# Patient Record
Sex: Male | Born: 1955 | Race: Black or African American | Hispanic: No | Marital: Married | Smoking: Never smoker
Health system: Southern US, Community
[De-identification: ages and names within clinical notes are randomized; demographics above are authoritative.]

## PROBLEM LIST (undated history)

## (undated) DIAGNOSIS — I10 Essential (primary) hypertension: Secondary | ICD-10-CM

## (undated) DIAGNOSIS — T7840XA Allergy, unspecified, initial encounter: Secondary | ICD-10-CM

## (undated) HISTORY — DX: Essential (primary) hypertension: I10

## (undated) HISTORY — DX: Allergy, unspecified, initial encounter: T78.40XA

---

## 2005-08-14 ENCOUNTER — Emergency Department: Payer: Self-pay | Admitting: Emergency Medicine

## 2007-05-24 ENCOUNTER — Inpatient Hospital Stay: Payer: Self-pay | Admitting: Internal Medicine

## 2007-05-24 ENCOUNTER — Other Ambulatory Visit: Payer: Self-pay

## 2009-02-18 ENCOUNTER — Ambulatory Visit: Payer: Self-pay | Admitting: Internal Medicine

## 2009-12-29 ENCOUNTER — Inpatient Hospital Stay: Payer: Self-pay | Admitting: Internal Medicine

## 2010-07-21 ENCOUNTER — Ambulatory Visit: Payer: Self-pay | Admitting: Nephrology

## 2010-10-24 ENCOUNTER — Emergency Department: Payer: Self-pay | Admitting: Emergency Medicine

## 2011-02-02 ENCOUNTER — Ambulatory Visit: Payer: Self-pay | Admitting: Surgery

## 2011-02-06 LAB — PATHOLOGY REPORT

## 2011-04-21 IMAGING — NM NM LUNG SCAN
2 series · 16 of 16 positions shown · non-contrast
Comparison: none

REASON FOR EXAM: sob cp hx of dvt
COMMENTS:

PROCEDURE:     NM  - NM VQ LUNG SCAN  - [DATE]  [DATE] [DATE]  [DATE]
RESULT:     History: Shortness of breath and chest pain.
Comparison Study: Prior V/Q scan of 05/26/2007.

[Series 1000: lung ventilation · 3.90mm/px · 4 acquisitions, 8 frames shown]
[im 1/4]
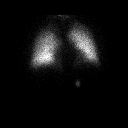
[im 1/4]
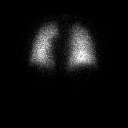
[im 2/4]
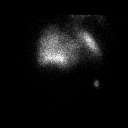
[im 2/4]
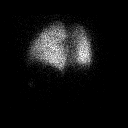
[im 3/4]
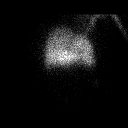
[im 3/4]
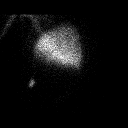
[im 4/4]
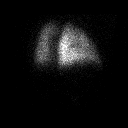
[im 4/4]
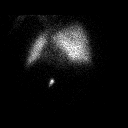

[Series 1000: lung perfusion · 1.95mm/px · 4 acquisitions, 8 frames shown]
[im 1/4]
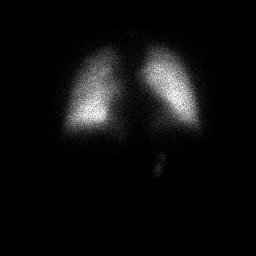
[im 1/4]
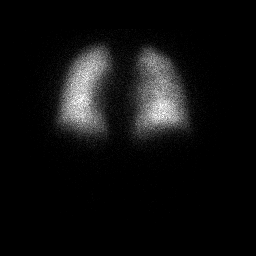
[im 2/4]
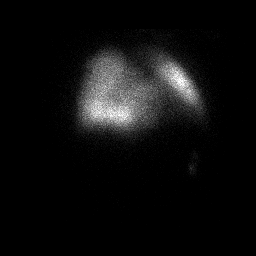
[im 2/4]
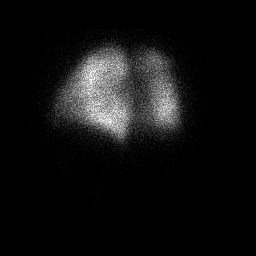
[im 3/4]
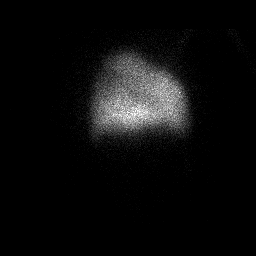
[im 3/4]
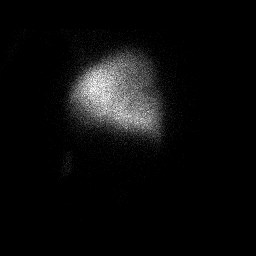
[im 4/4]
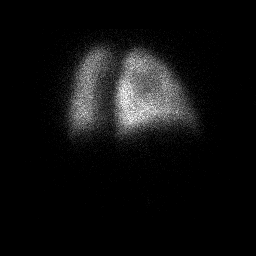
[im 4/4]
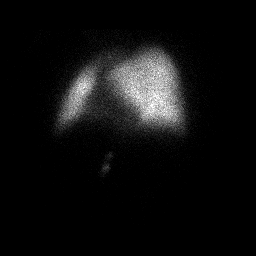

[16 of 16 positions shown; findings below may reference images not displayed]

FINDINGS: Standard ventilation perfusion scan performed with 37.4 mCi of
technetium 99 M DTPA and 4.3 mCi of technetium 99m MAA. Ventilation
perfusion scan is unchanged  from prior study of 1338. No prominent
ventilation perfusion mismatches are noted.
IMPRESSION: Low probability pulmonary embolus.

## 2014-03-03 ENCOUNTER — Ambulatory Visit: Payer: Self-pay | Admitting: Surgery

## 2014-03-04 LAB — PATHOLOGY REPORT

## 2014-12-11 NOTE — Op Note (Signed)
PATIENT NAME:  Reginald Stevens, Reginald Stevens MR#:  [id number] DATE OF BIRTH:  [birth year]  DATE OF PROCEDURE:  03/03/2014   His colonoscopy was done and an immediate computer generated report was made; however, it is now not found in the system, therefore this is being dictated.   PREOPERATIVE DIAGNOSIS: History of colonic polyps.  POSTOPERATIVE DIAGNOSIS:  Colonic polyps.   PROCEDURE: Colonoscopy with hot biopsy and fulguration of polyps.   SURGEON: Renda RollsWilton Smith, M.D.   ANESTHESIA: Monitored anesthesia care with intravenous sedation.   PROCEDURE IN DETAIL:  With the patient on the stretcher in the left lateral decubitus position and monitored with pulse oximetry and intermittent blood pressure recordings, was given oxygen via nasal cannula at a rate of 2 L/min.   Digital anorectal exam demonstrated no palpable mass.   The Olympus video colonoscope was inserted and was manipulated around to the cecum. A picture was taken of the appendiceal lumen and of the ileocecal valve. The scope was gradually pulled back examining the colonic mucosa. Colon preparation was good. A minimal amount of bilious feculent material was aspirated. The mucosa was typical in appearance. The scope was gradually pulled back to a point some 25 cm from the anal os finding a small benign-appearing polyp which just appeared to be about 4 or 5 mm in dimension and was removed with the hot biopsy forceps and cauterized the base, completely obliterating the polyp and was submitted in formalin for routine pathology. There was no significant bleeding at the site. The scope was gradually further pulled back and identified another small polyp in the rectum. The scope was retroflexed to view the distal rectum seeing some minimal hemorrhoidal veins. The scope was again straightened and further demonstrated distal rectal polyp which also appeared benign, was biopsied with the hot biopsy forceps and obliterated completely and submitted in a separate  container of formalin. A small amount of bleeding occurred and was cauterized and bleeding stopped. The scope was subsequently removed. Th patient appeared to be in entirely satisfactory condition and was prepared for transfer to the recovery room.     ____________________________ Shela CommonsJ. Renda RollsWilton Smith, MD jws:ds D: 03/09/2014 20:02:26 ET T: 03/09/2014 20:54:11 ET JOB#: [id number]  cc: Adella HareJ. Wilton Smith, MD, <Dictator> Adella HareWILTON J SMITH MD ELECTRONICALLY SIGNED 03/10/2014 9:07

## 2016-10-15 ENCOUNTER — Other Ambulatory Visit: Payer: Self-pay | Admitting: Internal Medicine

## 2016-10-15 ENCOUNTER — Ambulatory Visit
Admission: RE | Admit: 2016-10-15 | Discharge: 2016-10-15 | Disposition: A | Discharge status: Home / Self Care | Payer: Managed Care, Other (non HMO) | Source: Ambulatory Visit | Attending: Internal Medicine | Admitting: Internal Medicine

## 2016-10-15 DIAGNOSIS — R600 Localized edema: Secondary | ICD-10-CM

## 2017-05-08 IMAGING — US US EXTREM LOW VENOUS*R*
1 series · 13 of 24 positions shown · non-contrast
Comparison: Ultrasound 10/24/2010.

CLINICAL DATA: Right lower extremity edema.



[Series 1: us extrem low venous*right* · 0.10mm/px · 13 of 31 slices shown]
[im 1/31]
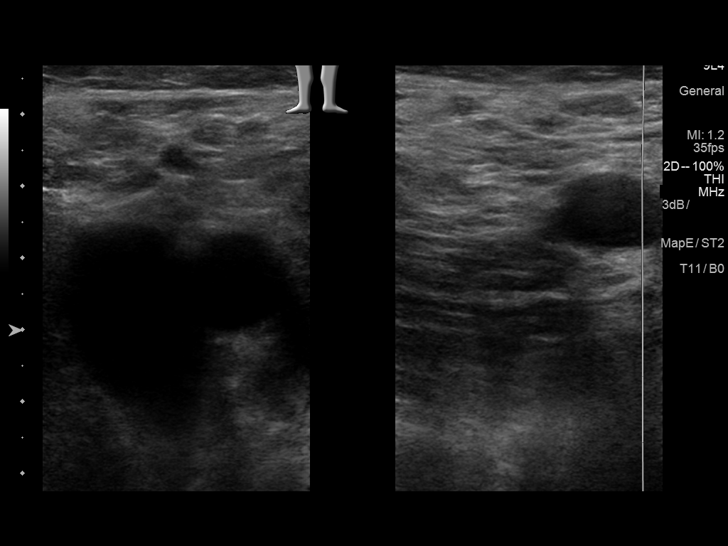
[im 3/31]
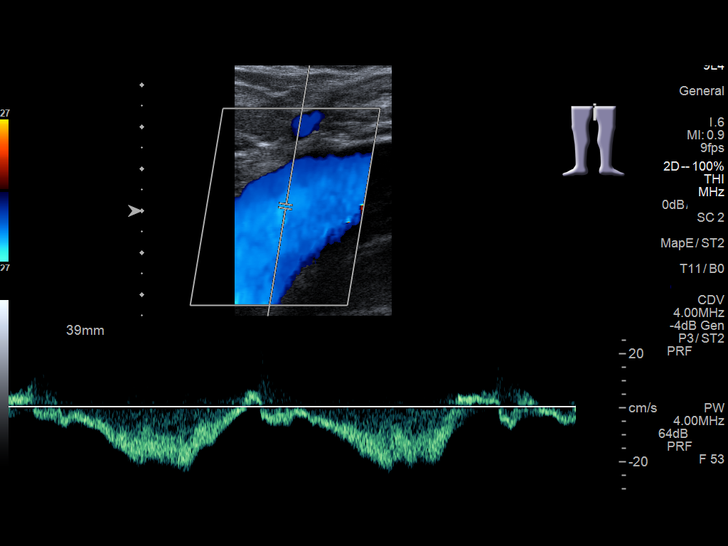
[im 6/31]
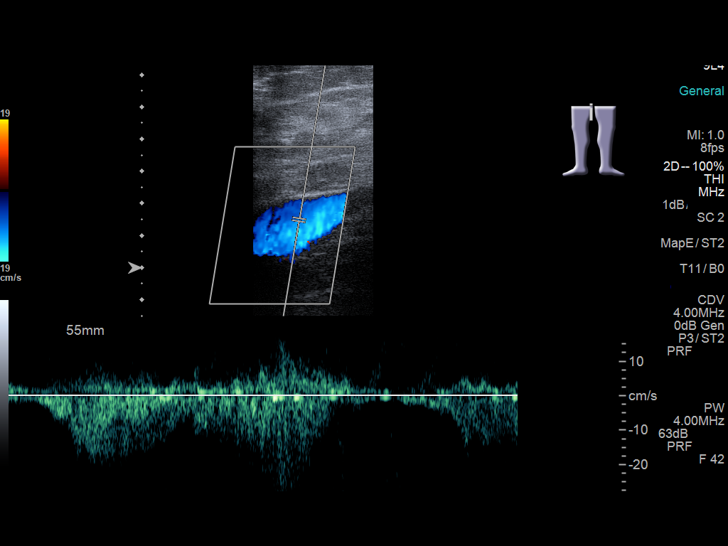
[im 8/31]
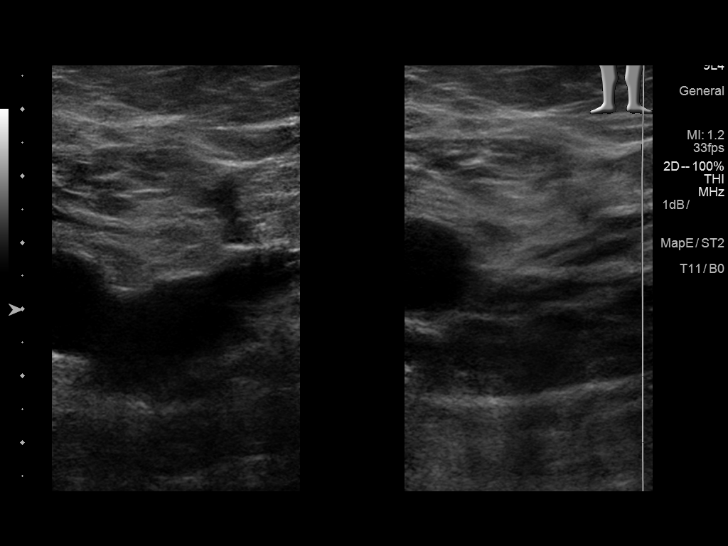
[im 11/31]
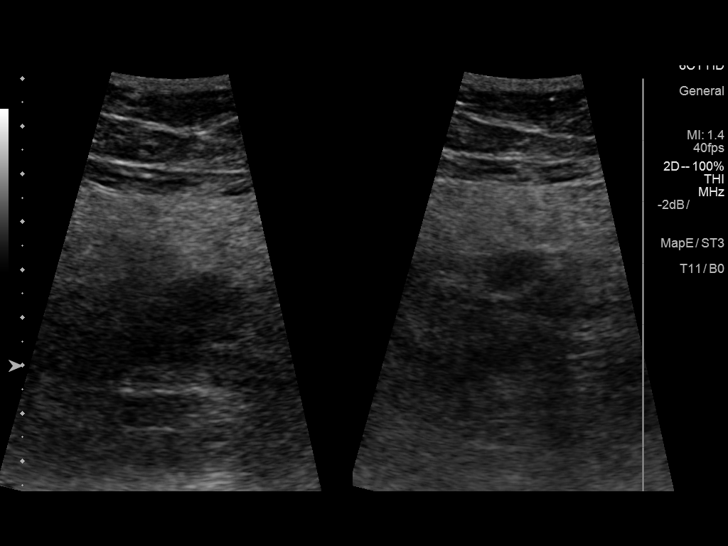
[im 14/31]
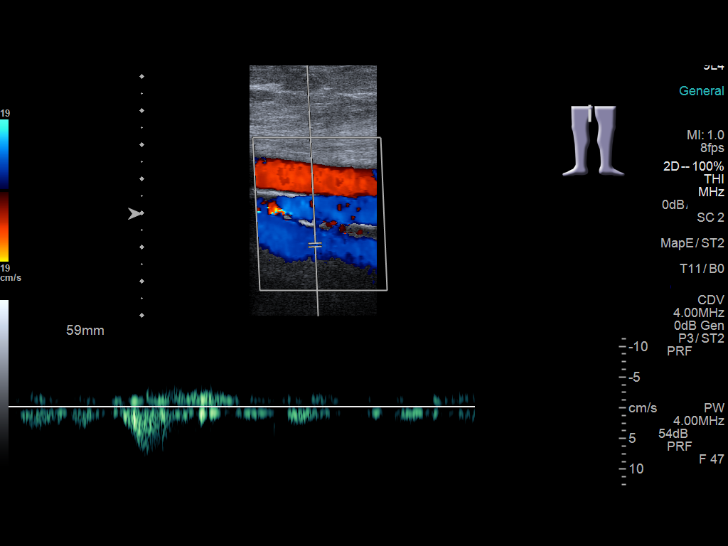
[im 16/31]
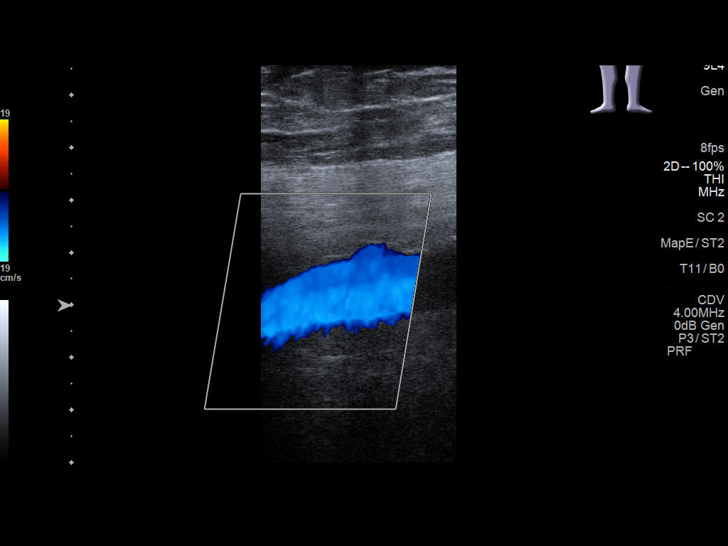
[im 17/31]
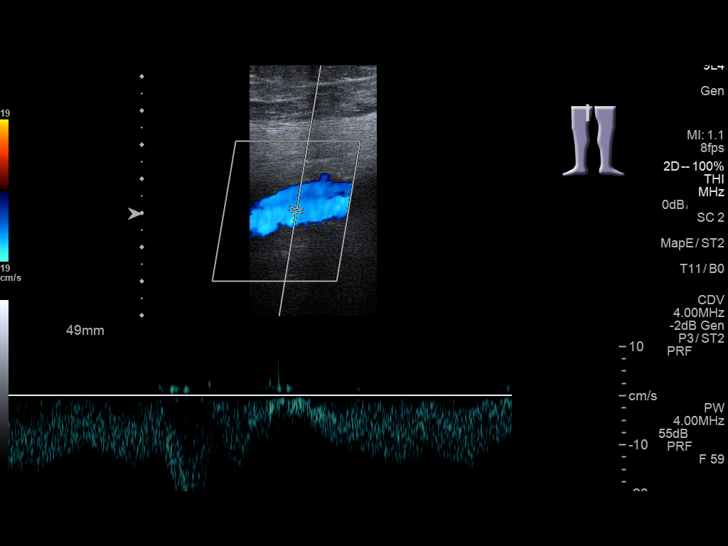
[im 20/31]
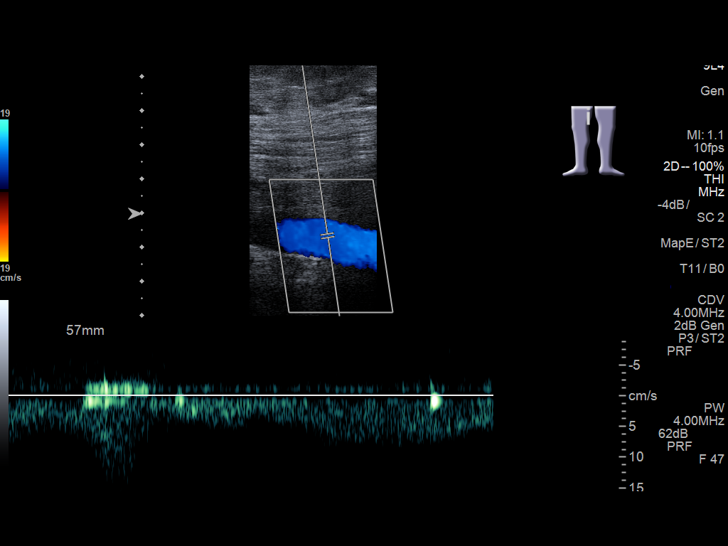
[im 23/31]
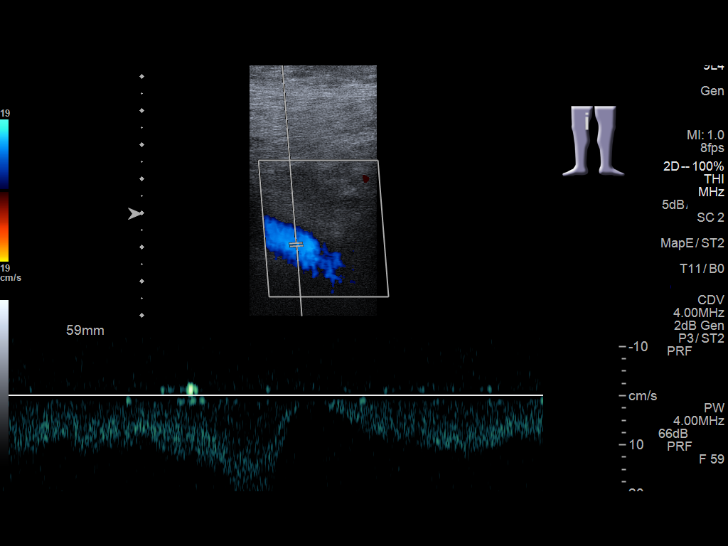
[im 25/31]
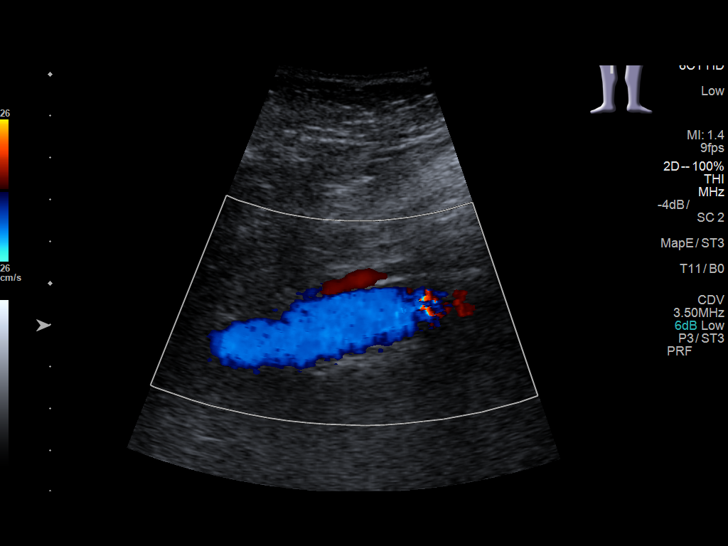
[im 28/31]
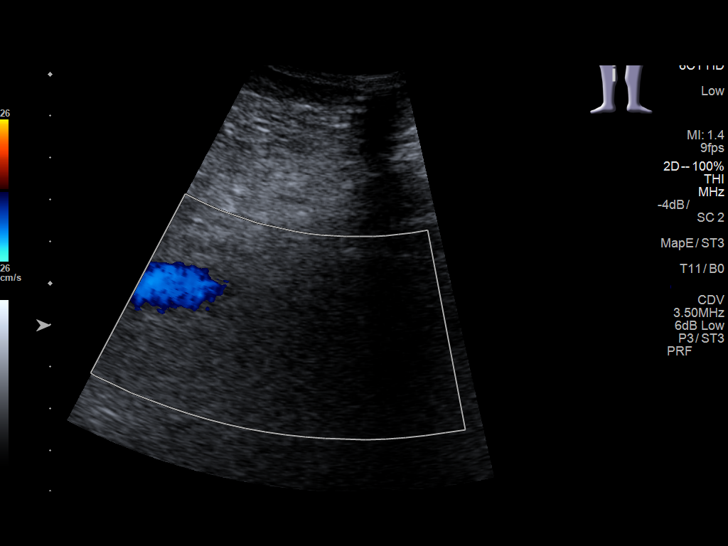
[im 31/31]
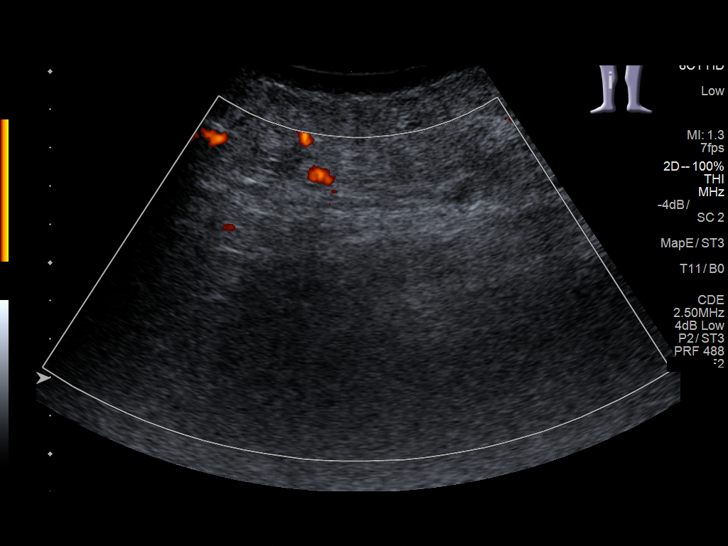

[13 of 24 positions shown; findings below may reference images not displayed]

FINDINGS: Contralateral Common Femoral Vein: Respiratory phasicity is normal
and symmetric with the symptomatic side. No evidence of thrombus.
Normal compressibility.

Common Femoral Vein: No evidence of thrombus. Normal
compressibility, respiratory phasicity and response to augmentation.

Saphenofemoral Junction: No evidence of thrombus. Normal
compressibility and flow on color Doppler imaging.

Profunda Femoral Vein: No evidence of thrombus. Normal
compressibility and flow on color Doppler imaging.

Femoral Vein: No evidence of thrombus. Normal compressibility,
respiratory phasicity and response to augmentation.

Popliteal Vein: No evidence of thrombus. Normal compressibility,
respiratory phasicity and response to augmentation.

Calf Veins: Poor visualization of calf veins due to overlying edema.

Superficial Great Saphenous Vein: No evidence of thrombus. Normal
compressibility and flow on color Doppler imaging.

Other Findings:  Soft tissue edema.
IMPRESSION: No evidence of deep venous thrombosis. Poor visualization of calf
veins due to overlying soft tissue edema .

## 2018-10-01 ENCOUNTER — Encounter: Payer: Self-pay | Admitting: Urology

## 2018-10-01 ENCOUNTER — Ambulatory Visit (INDEPENDENT_AMBULATORY_CARE_PROVIDER_SITE_OTHER): Payer: BLUE CROSS/BLUE SHIELD | Admitting: Urology

## 2018-10-01 DIAGNOSIS — R972 Elevated prostate specific antigen [PSA]: Secondary | ICD-10-CM

## 2018-10-01 LAB — BLADDER SCAN AMB NON-IMAGING: SCAN RESULT: 15

## 2018-10-01 NOTE — Patient Instructions (Addendum)

## 2018-10-01 NOTE — Progress Notes (Signed)
10/01/2018 11:02 AM   Reginald Stevens 06-03-1956 [id number]  Referring provider: Lynnea FerrierKlein, Bert J III, MD [address] [facility] [place], [place] [id number]  Chief Complaint  Patient presents with  . Elevated PSA    HPI:  Mr. Reginald Stevens is a 63 year old male with a rising PSA.  His PSA was the following: September 2016 PSA 2.61 November 2018 PSA 4.32 January 2020 PSA 6.66  No FH of PCa. His brother had liver cancer and dad esophageal.   The patient has an AUASS = 12, noc x 1, moxed QOL. He can get an erection but has trouble maintaining. He drives a fork lift.   Modifying factors: There are no other modifying factors  Associated signs and symptoms: There are no other associated signs and symptoms Aggravating and relieving factors: There are no other aggravating or relieving factors Severity: Moderate Duration: Persistent    PMH: Past Medical History:  Diagnosis Date  . Allergy   . Hypertension     Surgical History:   Home Medications:  Allergies as of 10/01/2018   Not on File     Medication List       Accurate as of October 01, 2018 11:02 AM. Always use your most recent med list.        fluticasone 50 MCG/ACT nasal spray Commonly known as:  FLONASE Place into the nose.   ibuprofen 200 MG tablet Commonly known as:  ADVIL,MOTRIN Take by mouth.   loratadine 10 MG tablet Commonly known as:  CLARITIN Take by mouth.   losartan 25 MG tablet Commonly known as:  COZAAR Take by mouth.   PROAIR HFA 108 (90 Base) MCG/ACT inhaler Generic drug:  albuterol INHALE 2 PUFFS EVERY 4 HOURS IF NEEDED FOR WHEEZING   vitamin B-12 1000 MCG tablet Commonly known as:  CYANOCOBALAMIN Take by mouth.       Allergies: Allergies not on file  Family History: Family History  Problem Relation Age of Onset  . Prostate cancer Neg Hx   . Bladder Cancer Neg Hx   . Kidney cancer Neg Hx     Social History:  reports that he has never smoked. He has never  used smokeless tobacco. He reports current alcohol use. He reports that he does not use drugs.  ROS: UROLOGY Frequent Urination?: Yes Hard to postpone urination?: Yes Burning/pain with urination?: No Get up at night to urinate?: Yes Leakage of urine?: No Urine stream starts and stops?: No Trouble starting stream?: No Do you have to strain to urinate?: No Blood in urine?: No Urinary tract infection?: No Sexually transmitted disease?: No Injury to kidneys or bladder?: No Painful intercourse?: No Weak stream?: No Erection problems?: Yes Penile pain?: No  Gastrointestinal Nausea?: No Vomiting?: No Indigestion/heartburn?: No Diarrhea?: No Constipation?: No  Constitutional Fever: No Night sweats?: No Weight loss?: No Fatigue?: No  Skin Skin rash/lesions?: No Itching?: No  Eyes Blurred vision?: No Double vision?: No  Ears/Nose/Throat Sore throat?: No Sinus problems?: No  Hematologic/Lymphatic Swollen glands?: No Easy bruising?: No  Cardiovascular Leg swelling?: Yes Chest pain?: No  Respiratory Cough?: Yes Shortness of breath?: Yes  Endocrine Excessive thirst?: No  Musculoskeletal Back pain?: Yes Joint pain?: Yes  Neurological Headaches?: No Dizziness?: No  Psychologic Depression?: No Anxiety?: No  Physical Exam: There were no vitals taken for this visit.  Constitutional:  Alert and oriented, No acute distress. HEENT: [place] AT, moist mucus membranes.  Trachea midline, no masses. Cardiovascular: No clubbing, cyanosis, or edema. Respiratory:  Normal respiratory effort, no increased work of breathing. GI: Abdomen is soft, nontender, nondistended, no abdominal masses GU: No CVA tenderness Lymph: No cervical or inguinal lymphadenopathy. Skin: No rashes, bruises or suspicious lesions. Neurologic: Grossly intact, no focal deficits, moving all 4 extremities. Psychiatric: Normal mood and affect. GU: difficult exam, apex normal.   Laboratory Data: No  results found for: WBC, HGB, HCT, MCV, PLT  No results found for: CREATININE  No results found for: PSA  No results found for: TESTOSTERONE  No results found for: HGBA1C  Urinalysis No results found for: COLORURINE, APPEARANCEUR, LABSPEC, PHURINE, GLUCOSEU, HGBUR, BILIRUBINUR, KETONESUR, PROTEINUR, UROBILINOGEN, NITRITE, LEUKOCYTESUR  No results found for: LABMICR, WBCUA, RBCUA, LABEPIT, MUCUS, BACTERIA  No results found for this or any previous visit. No results found for this or any previous visit. No results found for this or any previous visit. No results found for this or any previous visit. No results found for this or any previous visit. No results found for this or any previous visit. No results found for this or any previous visit. No results found for this or any previous visit.  Assessment & Plan:    1. Elevated PSA I had a long discussion with the patient on the nature of elevated PSA - benign vs malignant causes. We discussed age specific levels and that PCa can be seen on a biopsy with very low PSA levels (<=2.5). We discussed the nature risks and benefits of continued surveillance, other lab tests, imaging as well as prostate biopsy. We discussed the management of prostate cancer might include active surveillance or treatment depending on biopsy findings. All questions answered. He elects to proceed with biopsy.   - Bladder Scan (Post Void Residual) in office   No follow-ups on file.  Jerilee Field, MD  [facility] [address], [address] [place], [place] [address] [phone]

## 2018-10-15 ENCOUNTER — Ambulatory Visit (INDEPENDENT_AMBULATORY_CARE_PROVIDER_SITE_OTHER): Payer: BLUE CROSS/BLUE SHIELD | Admitting: Urology

## 2018-10-15 ENCOUNTER — Other Ambulatory Visit: Payer: Self-pay | Admitting: Urology

## 2018-10-15 ENCOUNTER — Encounter: Payer: Self-pay | Admitting: Urology

## 2018-10-15 VITALS — BP 134/82 | HR 92 | Wt 337.0 lb

## 2018-10-15 DIAGNOSIS — R972 Elevated prostate specific antigen [PSA]: Secondary | ICD-10-CM

## 2018-10-15 MED ORDER — GENTAMICIN SULFATE 40 MG/ML IJ SOLN
80.0000 mg | Freq: Once | INTRAMUSCULAR | Status: AC
  Administered 2018-10-15: 80 mg via INTRAMUSCULAR

## 2018-10-15 MED ORDER — LEVOFLOXACIN 500 MG PO TABS
500.0000 mg | ORAL_TABLET | Freq: Once | ORAL | Status: AC
  Administered 2018-10-15: 500 mg via ORAL

## 2018-10-15 NOTE — Patient Instructions (Signed)

## 2018-10-15 NOTE — Progress Notes (Signed)
Reginald Stevens is a 63 year old male with a rising PSA.  His PSA was the following: September 2016 PSA 2.61 November 2018 PSA 4.32 January 2020 PSA 6.66  No FH of PCa. His brother had liver cancer and dad esophageal.   The patient has an AUASS = 12, noc x 1, moxed QOL. He can get an erection but has trouble maintaining. 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   Prostate Biopsy Procedure   Informed consent was obtained after discussing risks/benefits of the procedure.  A time out was performed to ensure correct patient identity.  Pre-Procedure: - Last PSA Level: 6.66 - We drew another PSA prior to the biopsy for record.  - Gentamicin given prophylactically - Levaquin 500 mg administered PO -DRE: Even in lateral decubitus it is a difficult exam, only the apex palpable -Transrectal Ultrasound performed revealing a 87.71 gm prostate (6.08 x 4.83 x 5.70) -No significant hypoechoic or median lobe noted  Procedure: - Prostate block performed using 10 cc 1% lidocaine and biopsies taken from sextant areas, a total of 12 under ultrasound guidance.  Post-Procedure: - Patient tolerated the procedure well - He was counseled to seek immediate medical attention if experiences any severe pain, significant bleeding, or fevers - Return in one week to discuss biopsy results

## 2018-10-16 LAB — PSA: Prostate Specific Ag, Serum: 5.2 ng/mL — ABNORMAL HIGH (ref 0.0–4.0)

## 2018-10-20 ENCOUNTER — Other Ambulatory Visit: Payer: Self-pay | Admitting: Urology

## 2018-10-21 LAB — PATHOLOGY REPORT

## 2018-10-27 ENCOUNTER — Telehealth: Payer: Self-pay

## 2018-10-27 NOTE — Telephone Encounter (Signed)
Called and left message on machine for patient to return call. Per Dr Mena Goes, no cancer seen in biospy. He will go over everything and make a plan with patient at  10/29/18 visit.

## 2018-10-29 ENCOUNTER — Ambulatory Visit (INDEPENDENT_AMBULATORY_CARE_PROVIDER_SITE_OTHER): Payer: BLUE CROSS/BLUE SHIELD | Admitting: Urology

## 2018-10-29 ENCOUNTER — Other Ambulatory Visit: Payer: Self-pay

## 2018-10-29 ENCOUNTER — Encounter: Payer: Self-pay | Admitting: Urology

## 2018-10-29 VITALS — BP 128/81 | HR 91 | Wt 340.6 lb

## 2018-10-29 DIAGNOSIS — N138 Other obstructive and reflux uropathy: Secondary | ICD-10-CM

## 2018-10-29 DIAGNOSIS — N5201 Erectile dysfunction due to arterial insufficiency: Secondary | ICD-10-CM

## 2018-10-29 DIAGNOSIS — N401 Enlarged prostate with lower urinary tract symptoms: Secondary | ICD-10-CM

## 2018-10-29 DIAGNOSIS — R972 Elevated prostate specific antigen [PSA]: Secondary | ICD-10-CM

## 2018-10-29 DIAGNOSIS — R3912 Poor urinary stream: Secondary | ICD-10-CM

## 2018-10-29 MED ORDER — TADALAFIL 20 MG PO TABS: 20.0000 mg | ORAL_TABLET | Freq: Every day | ORAL | 6 refills | Status: DC | PRN

## 2018-10-29 NOTE — Progress Notes (Signed)
10/29/2018 11:34 AM   Josie Dixon 08-16-1956 [id number]  Referring provider: Lynnea Ferrier, MD [address] [facility] [place], [place] [address]  No chief complaint on file.   HPI:  Mr. Deboise is a 63 year old male with a rising PSA. His PSA was the following: September 2016 PSA 2.61 November 2018 PSA 4.32 January 2020 PSA 6.66 Feb 2020 PSA 5.2, Biopsy - benign and inflammation, prostate 88 g   No FH of PCa. His brother had liver cancer and dad esophageal.   The patient has an AUASS = 12, noc x 1, moxed QOL. He can get an erection but has trouble maintaining. He tried Cialis in the past. He drives a fork lift.  He returns to review bx results and management of BPH. He has no bothersome LUTS. He had no problems after the biopsy.    PMH: Past Medical History:  Diagnosis Date  . Allergy   . Hypertension     Surgical History: No past surgical history on file.  Home Medications:  Allergies as of 10/29/2018   No Known Allergies     Medication List       Accurate as of October 29, 2018 11:34 AM. Always use your most recent med list.        fluticasone 50 MCG/ACT nasal spray Commonly known as:  FLONASE Place into the nose.   ibuprofen 200 MG tablet Commonly known as:  ADVIL,MOTRIN Take by mouth.   loratadine 10 MG tablet Commonly known as:  CLARITIN Take by mouth.   losartan 25 MG tablet Commonly known as:  COZAAR Take by mouth.   ProAir HFA 108 (90 Base) MCG/ACT inhaler Generic drug:  albuterol INHALE 2 PUFFS EVERY 4 HOURS IF NEEDED FOR WHEEZING   vitamin B-12 1000 MCG tablet Commonly known as:  CYANOCOBALAMIN Take by mouth.       Allergies: No Known Allergies  Family History: Family History  Problem Relation Age of Onset  . Prostate cancer Neg Hx   . Bladder Cancer Neg Hx   . Kidney cancer Neg Hx     Social History:  reports that he has never smoked. He has never used smokeless tobacco. He reports current  alcohol use. He reports that he does not use drugs.  ROS:                                        Physical Exam: There were no vitals taken for this visit.  Constitutional:  Alert and oriented, No acute distress. HEENT: [place] AT, moist mucus membranes.  Trachea midline, no masses. Cardiovascular: No clubbing, cyanosis, or edema. Respiratory: Normal respiratory effort, no increased work of breathing. GI: Abdomen is soft, nontender, nondistended, no abdominal masses Skin: No rashes, bruises or suspicious lesions. Neurologic: Grossly intact, no focal deficits, moving all 4 extremities. Psychiatric: Normal mood and affect.  Laboratory Data: No results found for: WBC, HGB, HCT, MCV, PLT  No results found for: CREATININE  No results found for: PSA  No results found for: TESTOSTERONE  No results found for: HGBA1C  Urinalysis No results found for: COLORURINE, APPEARANCEUR, LABSPEC, PHURINE, GLUCOSEU, HGBUR, BILIRUBINUR, KETONESUR, PROTEINUR, UROBILINOGEN, NITRITE, LEUKOCYTESUR  No results found for: LABMICR, WBCUA, RBCUA, LABEPIT, MUCUS, BACTERIA   No results found for this or any previous visit. No results found for this or any previous visit. No results found for this or  any previous visit. No results found for this or any previous visit. No results found for this or any previous visit. No results found for this or any previous visit. No results found for this or any previous visit. No results found for this or any previous visit.  Assessment & Plan:    PSA - biopsy benign and PSA density low. Follow.   BPH, luts - discussed surveillance, alpha blockers, daily PDE 5 inhibitor, 5ari and procedures.  He is not particularly bothered by any symptoms and will continue surveillance.  ED - discussed nature r/b of pde5i and Cialis sent.   No follow-ups on file.  Jerilee Field, MD  [facility] [address]  [place], [place] [phone]

## 2018-10-29 NOTE — Patient Instructions (Signed)

## 2019-04-29 ENCOUNTER — Encounter: Payer: Self-pay | Admitting: Urology

## 2019-04-29 ENCOUNTER — Ambulatory Visit: Payer: BC Managed Care – PPO | Admitting: Urology

## 2019-04-29 ENCOUNTER — Other Ambulatory Visit: Payer: Self-pay

## 2019-04-29 VITALS — BP 150/83 | HR 85 | Ht 74.0 in | Wt 340.0 lb

## 2019-04-29 DIAGNOSIS — N401 Enlarged prostate with lower urinary tract symptoms: Secondary | ICD-10-CM | POA: Diagnosis not present

## 2019-04-29 DIAGNOSIS — N5201 Erectile dysfunction due to arterial insufficiency: Secondary | ICD-10-CM | POA: Diagnosis not present

## 2019-04-29 DIAGNOSIS — R972 Elevated prostate specific antigen [PSA]: Secondary | ICD-10-CM

## 2019-04-29 DIAGNOSIS — N138 Other obstructive and reflux uropathy: Secondary | ICD-10-CM | POA: Diagnosis not present

## 2019-04-29 MED ORDER — SILDENAFIL CITRATE 20 MG PO TABS: 20.0000 mg | ORAL_TABLET | Freq: Every day | ORAL | 11 refills | Status: AC

## 2019-04-29 NOTE — Progress Notes (Signed)
04/29/2019 11:05 AM   Reginald Stevens November 22, 1955 [id number]  Referring provider: Adin Hector, MD [place] [facility] [place],  [place] [address]  Chief Complaint  Patient presents with  . Follow-up    HPI:  F/u -   1) Elevated PSA - His PSA was the following: September 2016 PSA 2.61 November 2018 PSA 4.32 January 2020 PSA 6.66 Feb 2020 PSA 5.2, Biopsy - benign and inflammation, prostate 88 g   No FH of PCa. His brother had liver cancer and dad esophageal.  The patient has an AUASS = 12, noc x 1, moxed QOL.   2) ED - He can get an erection but has trouble maintaining. He tried Cialis in the past. He drives a fork lift.Cialis rx'd Mar 2020.   I ordered a PSA but don't see that it was done. He was not able to pick up Cialis as it was expensive. No voiding complaints.  PMH: Past Medical History:  Diagnosis Date  . Allergy   . Hypertension     Surgical History: No past surgical history on file.  Home Medications:  Allergies as of 04/29/2019   No Known Allergies     Medication List       Accurate as of April 29, 2019 11:05 AM. If you have any questions, ask your nurse or doctor.        STOP taking these medications   losartan 25 MG tablet Commonly known as: COZAAR Stopped by: Festus Aloe, MD     TAKE these medications   fluticasone 50 MCG/ACT nasal spray Commonly known as: FLONASE Place into the nose.   ibuprofen 200 MG tablet Commonly known as: ADVIL Take by mouth.   loratadine 10 MG tablet Commonly known as: CLARITIN Take by mouth.   ProAir HFA 108 (90 Base) MCG/ACT inhaler Generic drug: albuterol INHALE 2 PUFFS EVERY 4 HOURS IF NEEDED FOR WHEEZING   Symbicort 160-4.5 MCG/ACT inhaler Generic drug: budesonide-formoterol Inhale into the lungs.   tadalafil 20 MG tablet Commonly known as: CIALIS Take 1 tablet (20 mg total) by mouth daily as needed for erectile dysfunction.   vitamin B-12 1000 MCG tablet  Commonly known as: CYANOCOBALAMIN Take by mouth.       Allergies: No Known Allergies  Family History: Family History  Problem Relation Age of Onset  . Prostate cancer Neg Hx   . Bladder Cancer Neg Hx   . Kidney cancer Neg Hx     Social History:  reports that he has never smoked. He has never used smokeless tobacco. He reports current alcohol use. He reports that he does not use drugs.  ROS: UROLOGY Frequent Urination?: Yes Hard to postpone urination?: No Burning/pain with urination?: No Get up at night to urinate?: Yes Leakage of urine?: No Urine stream starts and stops?: No Trouble starting stream?: No Do you have to strain to urinate?: No Blood in urine?: No Urinary tract infection?: No Sexually transmitted disease?: No Injury to kidneys or bladder?: No Painful intercourse?: No Weak stream?: No Erection problems?: No Penile pain?: No  Gastrointestinal Nausea?: No Vomiting?: No Indigestion/heartburn?: No Diarrhea?: No Constipation?: No  Constitutional Fever: No Night sweats?: No Weight loss?: No Fatigue?: No  Skin Skin rash/lesions?: No Itching?: No  Eyes Blurred vision?: No Double vision?: No  Ears/Nose/Throat Sore throat?: No Sinus problems?: No  Hematologic/Lymphatic Swollen glands?: No Easy bruising?: No  Cardiovascular Leg swelling?: No Chest pain?: No  Respiratory Cough?: No Shortness of breath?: No  Endocrine Excessive thirst?: No  Musculoskeletal Back pain?: No Joint pain?: No  Neurological Headaches?: No Dizziness?: No  Psychologic Depression?: No Anxiety?: No  Physical Exam: BP (!) 150/83   Pulse 85   Ht 6\' 2"  (1.88 m)   Wt (!) 154.2 kg   BMI 43.65 kg/m   Constitutional:  Alert and oriented, No acute distress. HEENT: [place] AT, moist mucus membranes.  Trachea midline, no masses. Cardiovascular: No clubbing, cyanosis, or edema. Respiratory: Normal respiratory effort, no increased work of breathing. GI: Abdomen  is soft, nontender, nondistended, no abdominal masses Lymph: No cervical or inguinal lymphadenopathy. Skin: No rashes, bruises or suspicious lesions. Neurologic: Grossly intact, no focal deficits, moving all 4 extremities. Psychiatric: Normal mood and affect.  Laboratory Data: No results found for: WBC, HGB, HCT, MCV, PLT  No results found for: CREATININE  No results found for: PSA  No results found for: TESTOSTERONE  No results found for: HGBA1C  Urinalysis No results found for: COLORURINE, APPEARANCEUR, LABSPEC, PHURINE, GLUCOSEU, HGBUR, BILIRUBINUR, KETONESUR, PROTEINUR, UROBILINOGEN, NITRITE, LEUKOCYTESUR  No results found for: LABMICR, WBCUA, RBCUA, LABEPIT, MUCUS, BACTERIA  Pertinent Imaging: n/a No results found for this or any previous visit. No results found for this or any previous visit. No results found for this or any previous visit. No results found for this or any previous visit. No results found for this or any previous visit. No results found for this or any previous visit. No results found for this or any previous visit. No results found for this or any previous visit.  Assessment & Plan:    BPH - stable   ED - rx for sildenafil sent - discussed again nature r/b/a to pde5i and off label use of 20 mg tabs.   PSA - PSA was sent. Discussed possible need for MRI.   No follow-ups on file.  Jerilee FieldMatthew Rudy Domek, MD  [facility] [address], [address] [place], [place] [address] [phone]

## 2019-04-29 NOTE — Patient Instructions (Signed)
Sildenafil tablets (Erectile Dysfunction) What is this medicine? SILDENAFIL (sil DEN a fil) is used to treat erection problems in men. This medicine may be used for other purposes; ask your health care provider or pharmacist if you have questions. COMMON BRAND NAME(S): Viagra What should I tell my health care provider before I take this medicine? They need to know if you have any of these conditions:  bleeding disorders  eye or vision problems, including a rare inherited eye disease called retinitis pigmentosa  anatomical deformation of the penis, Peyronie's disease, or history of priapism (painful and prolonged erection)  heart disease, angina, a history of heart attack, irregular heart beats, or other heart problems  high or low blood pressure  history of blood diseases, like sickle cell anemia or leukemia  history of stomach bleeding  kidney disease  liver disease  stroke  an unusual or allergic reaction to sildenafil, other medicines, foods, dyes, or preservatives  pregnant or trying to get pregnant  breast-feeding How should I use this medicine? Take this medicine by mouth with a glass of water. Follow the directions on the prescription label. The dose is usually taken 1 hour before sexual activity. You should not take the dose more than once per day. Do not take your medicine more often than directed. Talk to your pediatrician regarding the use of this medicine in children. This medicine is not used in children for this condition. Overdosage: If you think you have taken too much of this medicine contact a poison control center or emergency room at once. NOTE: This medicine is only for you. Do not share this medicine with others. What if I miss a dose? This does not apply. Do not take double or extra doses. What may interact with this medicine? Do not take this medicine with any of the following medications:  cisapride  nitrates like amyl nitrite, isosorbide  dinitrate, isosorbide mononitrate, nitroglycerin  riociguat This medicine may also interact with the following medications:  antiviral medicines for HIV or AIDS  bosentan  certain medicines for benign prostatic hyperplasia (BPH)  certain medicines for blood pressure  certain medicines for fungal infections like ketoconazole and itraconazole  cimetidine  erythromycin  rifampin This list may not describe all possible interactions. Give your health care provider a list of all the medicines, herbs, non-prescription drugs, or dietary supplements you use. Also tell them if you smoke, drink alcohol, or use illegal drugs. Some items may interact with your medicine. What should I watch for while using this medicine? If you notice any changes in your vision while taking this drug, call your doctor or health care professional as soon as possible. Stop using this medicine and call your health care provider right away if you have a loss of sight in one or both eyes. Contact your doctor or health care professional right away if you have an erection that lasts longer than 4 hours or if it becomes painful. This may be a sign of a serious problem and must be treated right away to prevent permanent damage. If you experience symptoms of nausea, dizziness, chest pain or arm pain upon initiation of sexual activity after taking this medicine, you should refrain from further activity and call your doctor or health care professional as soon as possible. Do not drink alcohol to excess (examples, 5 glasses of wine or 5 shots of whiskey) when taking this medicine. When taken in excess, alcohol can increase your chances of getting a headache or getting dizzy, increasing   your heart rate or lowering your blood pressure. Using this medicine does not protect you or your partner against HIV infection (the virus that causes AIDS) or other sexually transmitted diseases. What side effects may I notice from receiving this  medicine? Side effects that you should report to your doctor or health care professional as soon as possible:  allergic reactions like skin rash, itching or hives, swelling of the face, lips, or tongue  breathing problems  changes in hearing  changes in vision  chest pain  fast, irregular heartbeat  prolonged or painful erection  seizures Side effects that usually do not require medical attention (report to your doctor or health care professional if they continue or are bothersome):  back pain  dizziness  flushing  headache  indigestion  muscle aches  nausea  stuffy or runny nose This list may not describe all possible side effects. Call your doctor for medical advice about side effects. You may report side effects to FDA at 1-800-FDA-1088. Where should I keep my medicine? Keep out of reach of children. Store at room temperature between 15 and 30 degrees C (59 and 86 degrees F). Throw away any unused medicine after the expiration date. NOTE: This sheet is a summary. It may not cover all possible information. If you have questions about this medicine, talk to your doctor, pharmacist, or health care provider.  2020 Elsevier/Gold Standard (2015-07-20 12:00:25)  

## 2019-04-30 LAB — PSA, TOTAL AND FREE
PSA, Free Pct: 17.8 %
PSA, Free: 0.64 ng/mL
Prostate Specific Ag, Serum: 3.6 ng/mL (ref 0.0–4.0)

## 2019-05-01 ENCOUNTER — Telehealth: Payer: Self-pay | Admitting: Family Medicine

## 2019-05-01 NOTE — Telephone Encounter (Signed)
Patient notified and voiced understanding.

## 2019-05-01 NOTE — Telephone Encounter (Signed)
-----   Message from Festus Aloe, MD sent at 04/30/2019  9:00 PM EDT ----- Let Mr Cappuccio know his PSA has returned to normal> looks great! Give result.  ----- Message ----- From: Kyra Manges, CMA Sent: 04/30/2019   8:42 AM EDT To: Festus Aloe, MD   ----- Message ----- From: Lavone Neri Lab Results In Sent: 04/30/2019   5:38 AM EDT To: Rowe [facility]

## 2019-05-01 NOTE — Telephone Encounter (Signed)
LMOM for patient to return call.

## 2019-10-27 ENCOUNTER — Encounter: Payer: Self-pay | Admitting: Urology

## 2019-10-27 ENCOUNTER — Other Ambulatory Visit: Payer: BC Managed Care – PPO

## 2019-10-29 ENCOUNTER — Encounter: Payer: Self-pay | Admitting: Urology

## 2020-05-26 ENCOUNTER — Other Ambulatory Visit: Admission: RE | Admit: 2020-05-26 | Payer: Managed Care, Other (non HMO) | Source: Ambulatory Visit

## 2020-05-30 ENCOUNTER — Encounter: Admission: RE | Payer: Self-pay | Source: Home / Self Care

## 2020-05-30 ENCOUNTER — Ambulatory Visit
Admission: RE | Admit: 2020-05-30 | Payer: BC Managed Care – PPO | Source: Home / Self Care | Admitting: Internal Medicine

## 2020-05-30 SURGERY — COLONOSCOPY WITH PROPOFOL
Anesthesia: General

## 2020-06-30 ENCOUNTER — Other Ambulatory Visit
Admission: RE | Admit: 2020-06-30 | Discharge: 2020-06-30 | Disposition: A | Discharge status: Home / Self Care | Payer: BC Managed Care – PPO | Source: Ambulatory Visit | Attending: Internal Medicine | Admitting: Internal Medicine

## 2020-06-30 ENCOUNTER — Other Ambulatory Visit: Payer: Self-pay

## 2020-06-30 DIAGNOSIS — Z01812 Encounter for preprocedural laboratory examination: Secondary | ICD-10-CM | POA: Diagnosis not present

## 2020-06-30 DIAGNOSIS — Z20822 Contact with and (suspected) exposure to covid-19: Secondary | ICD-10-CM | POA: Insufficient documentation

## 2020-07-01 LAB — SARS CORONAVIRUS 2 (TAT 6-24 HRS): SARS Coronavirus 2: NEGATIVE

## 2020-07-04 ENCOUNTER — Ambulatory Visit
Admission: RE | Admit: 2020-07-04 | Discharge: 2020-07-04 | Disposition: A | Discharge status: Home / Self Care | Payer: BC Managed Care – PPO | Attending: Internal Medicine | Admitting: Internal Medicine

## 2020-07-04 ENCOUNTER — Ambulatory Visit: Payer: BC Managed Care – PPO | Admitting: Anesthesiology

## 2020-07-04 ENCOUNTER — Encounter
Admission: RE | Disposition: A | Discharge status: Home / Self Care | Payer: Self-pay | Source: Home / Self Care | Attending: Internal Medicine

## 2020-07-04 ENCOUNTER — Encounter: Payer: Self-pay | Admitting: Internal Medicine

## 2020-07-04 DIAGNOSIS — Z791 Long term (current) use of non-steroidal anti-inflammatories (NSAID): Secondary | ICD-10-CM | POA: Insufficient documentation

## 2020-07-04 DIAGNOSIS — K5730 Diverticulosis of large intestine without perforation or abscess without bleeding: Secondary | ICD-10-CM | POA: Insufficient documentation

## 2020-07-04 DIAGNOSIS — Z6841 Body Mass Index (BMI) 40.0 and over, adult: Secondary | ICD-10-CM | POA: Diagnosis not present

## 2020-07-04 DIAGNOSIS — Z7951 Long term (current) use of inhaled steroids: Secondary | ICD-10-CM | POA: Insufficient documentation

## 2020-07-04 DIAGNOSIS — K640 First degree hemorrhoids: Secondary | ICD-10-CM | POA: Diagnosis not present

## 2020-07-04 DIAGNOSIS — I10 Essential (primary) hypertension: Secondary | ICD-10-CM | POA: Insufficient documentation

## 2020-07-04 DIAGNOSIS — Z86010 Personal history of colonic polyps: Secondary | ICD-10-CM | POA: Insufficient documentation

## 2020-07-04 DIAGNOSIS — Z1211 Encounter for screening for malignant neoplasm of colon: Secondary | ICD-10-CM | POA: Insufficient documentation

## 2020-07-04 HISTORY — PX: COLONOSCOPY WITH PROPOFOL: SHX5780

## 2020-07-04 SURGERY — COLONOSCOPY WITH PROPOFOL
Anesthesia: General

## 2020-07-04 MED ORDER — LIDOCAINE HCL (PF) 1 % IJ SOLN
INTRAMUSCULAR | Status: AC
  Filled 2020-07-04: qty 2

## 2020-07-04 MED ORDER — PROPOFOL 10 MG/ML IV BOLUS
INTRAVENOUS | Status: DC | PRN
  Administered 2020-07-04: 100 mg via INTRAVENOUS

## 2020-07-04 MED ORDER — PROPOFOL 500 MG/50ML IV EMUL
INTRAVENOUS | Status: DC | PRN
  Administered 2020-07-04: 150 ug/kg/min via INTRAVENOUS

## 2020-07-04 MED ORDER — SODIUM CHLORIDE 0.9 % IV SOLN
INTRAVENOUS | Status: DC
  Administered 2020-07-04: 1000 mL via INTRAVENOUS

## 2020-07-04 MED ORDER — LIDOCAINE HCL (CARDIAC) PF 100 MG/5ML IV SOSY
PREFILLED_SYRINGE | INTRAVENOUS | Status: DC | PRN
  Administered 2020-07-04: 40 mg via INTRAVENOUS

## 2020-07-04 NOTE — Op Note (Signed)
[facility] Patient Name: Reginald Stevens Procedure Date: 07/04/2020 1:08 PM MRN: [id number] Account #: [id number] Date of Birth: March 21, 1956 Admit Type: Outpatient Age: 64 Room: [facility] ENDO ROOM 2 Gender: Male Note Status: Finalized Procedure:             Colonoscopy Indications:           High risk colon cancer surveillance: Personal history                         of colonic polyps Providers:             Boykin Nearing. Norma Fredrickson MD, MD Referring MD:          Daniel Nones, MD (Referring MD) Medicines:             Propofol per Anesthesia Complications:         No immediate complications. Procedure:             Pre-Anesthesia Assessment:                        - The risks and benefits of the procedure and the                         sedation options and risks were discussed with the                         patient. All questions were answered and informed                         consent was obtained.                        - Patient identification and proposed procedure were                         verified prior to the procedure by the nurse. The                         procedure was verified in the procedure room.                        - ASA Grade Assessment: III - A patient with severe                         systemic disease.                        - After reviewing the risks and benefits, the patient                         was deemed in satisfactory condition to undergo the                         procedure.                        After obtaining informed consent, the colonoscope was                         passed under direct vision. Throughout the procedure,  the patient's blood pressure, pulse, and oxygen                         saturations were monitored continuously. The                         Colonoscope was introduced through the anus and                         advanced to the the cecum, identified by appendiceal                          orifice and ileocecal valve. The colonoscopy was                         performed without difficulty. The patient tolerated                         the procedure well. The quality of the bowel                         preparation was good. The ileocecal valve, appendiceal                         orifice, and rectum were photographed. Findings:      The perianal and digital rectal examinations were normal. Pertinent       negatives include normal sphincter tone and no palpable rectal lesions.      A few small-mouthed diverticula were found in the sigmoid colon.      Internal hemorrhoids were found during retroflexion. The hemorrhoids       were Grade I (internal hemorrhoids that do not prolapse).      The exam was otherwise without abnormality. Impression:            - Diverticulosis in the sigmoid colon.                        - Internal hemorrhoids.                        - No specimens collected. Recommendation:        - Patient has a contact number available for                         emergencies. The signs and symptoms of potential                         delayed complications were discussed with the patient.                         Return to normal activities tomorrow. Written                         discharge instructions were provided to the patient.                        - Resume previous diet.                        - Continue  present medications.                        - Repeat colonoscopy in 10 years for screening                         purposes.                        - Return to GI office PRN.                        - The findings and recommendations were discussed with                         the patient. Procedure Code(s):     --- Professional ---                        R7408, Colorectal cancer screening; colonoscopy on                         individual at high risk Diagnosis Code(s):     --- Professional ---                        K57.30,  Diverticulosis of large intestine without                         perforation or abscess without bleeding                        K64.0, First degree hemorrhoids                        Z86.010, Personal history of colonic polyps CPT copyright 2019 American Medical Association. All rights reserved. The codes documented in this report are preliminary and upon coder review may  be revised to meet current compliance requirements. Stanton Kidney MD, MD 07/04/2020 1:25:13 PM This report has been signed electronically. Number of Addenda: 0 Note Initiated On: 07/04/2020 1:08 PM Scope Withdrawal Time: 0 hours 6 minutes 27 seconds  Total Procedure Duration: 0 hours 8 minutes 20 seconds  Estimated Blood Loss:  Estimated blood loss: none. Estimated blood loss: none.      [facility]

## 2020-07-04 NOTE — Transfer of Care (Signed)
°  Immediate Anesthesia Transfer of Care Note  Patient: Reginald Stevens  Procedure(s) Performed: Procedure(s): COLONOSCOPY WITH PROPOFOL (N/A)  Patient Location: PACU and Endoscopy Unit  Anesthesia Type:General  Level of Consciousness: sedated  Airway & Oxygen Therapy: Patient Spontanous Breathing and Patient connected to nasal cannula oxygen  Post-op Assessment: Report given to RN and Post -op Vital signs reviewed and stable  Post vital signs: Reviewed and stable  Last Vitals:  Vitals:   07/04/20 1206 07/04/20 1325  BP: 137/89 103/63  Pulse: 93 84  Resp: 17 20  Temp: 36.5 C   SpO2: 98% 100%    Complications: No apparent anesthesia complications

## 2020-07-04 NOTE — Anesthesia Preprocedure Evaluation (Addendum)
Anesthesia Evaluation  Patient identified by MRN, date of birth, ID band Patient awake    Reviewed: Allergy & Precautions, H&P , NPO status , Patient's Chart, lab work & pertinent test results  History of Anesthesia Complications Negative for: history of anesthetic complications  Airway Mallampati: III  TM Distance: >3 FB Neck ROM: full    Dental  (+) Upper Dentures, Lower Dentures   Pulmonary neg pulmonary ROS, neg sleep apnea, neg COPD,    breath sounds clear to auscultation       Cardiovascular hypertension, (-) angina(-) Past MI and (-) Cardiac Stents (-) dysrhythmias  Rhythm:regular Rate:Normal     Neuro/Psych negative neurological ROS  negative psych ROS   GI/Hepatic negative GI ROS, Neg liver ROS,   Endo/Other  Morbid obesity  Renal/GU negative Renal ROS  negative genitourinary   Musculoskeletal   Abdominal   Peds  Hematology negative hematology ROS (+)   Anesthesia Other Findings Past Medical History: No date: Allergy No date: Hypertension  No past surgical history on file.  BMI    Body Mass Index: 46.03 kg/m      Reproductive/Obstetrics negative OB ROS                            Anesthesia Physical Anesthesia Plan  ASA: III  Anesthesia Plan: General   Post-op Pain Management:    Induction:   PONV Risk Score and Plan: Propofol infusion and TIVA  Airway Management Planned: Nasal Cannula  Additional Equipment:   Intra-op Plan:   Post-operative Plan:   Informed Consent: I have reviewed the patients History and Physical, chart, labs and discussed the procedure including the risks, benefits and alternatives for the proposed anesthesia with the patient or authorized representative who has indicated his/her understanding and acceptance.     Dental Advisory Given  Plan Discussed with: Anesthesiologist, CRNA and Surgeon  Anesthesia Plan Comments:          Anesthesia Quick Evaluation

## 2020-07-04 NOTE — Interval H&P Note (Signed)
History and Physical Interval Note:  07/04/2020 1:00 PM  Reginald Stevens  has presented today for surgery, with the diagnosis of PRS HX COLON POLYPS.  The various methods of treatment have been discussed with the patient and family. After consideration of risks, benefits and other options for treatment, the patient has consented to  Procedure(s): COLONOSCOPY WITH PROPOFOL (N/A) as a surgical intervention.  The patient's history has been reviewed, patient examined, no change in status, stable for surgery.  I have reviewed the patient's chart and labs.  Questions were answered to the patient's satisfaction.     [place], [place]

## 2020-07-04 NOTE — H&P (Signed)
Outpatient short stay form Pre-procedure 07/04/2020 12:59 PM Aahana Elza K. Norma Fredrickson, M.D.  Primary Physician: Daniel Nones III, M.D.  Reason for visit:  Personal history of adenomatous colon polyps (2012).  History of present illness:                          Patient presents for colonoscopy for a personal hx of colon polyps. The patient denies abdominal pain, abnormal weight loss or rectal bleeding.      Current Facility-Administered Medications:  .  0.9 %  sodium chloride infusion, , Intravenous, Continuous, Gann, Boykin Nearing, MD, Last Rate: 20 mL/hr at 07/04/20 1235, 1,000 mL at 07/04/20 1235 .  lidocaine (PF) (XYLOCAINE) 1 % injection, , , ,   Medications Prior to Admission  Medication Sig Dispense Refill Last Dose  . albuterol (PROAIR HFA) 108 (90 Base) MCG/ACT inhaler INHALE 2 PUFFS EVERY 4 HOURS IF NEEDED FOR WHEEZING     . budesonide-formoterol (SYMBICORT) 160-4.5 MCG/ACT inhaler Inhale into the lungs.     . fluticasone (FLONASE) 50 MCG/ACT nasal spray Place into the nose.     . ibuprofen (ADVIL,MOTRIN) 200 MG tablet Take by mouth.     . loratadine (CLARITIN) 10 MG tablet Take by mouth.     . sildenafil (REVATIO) 20 MG tablet Take 1 tablet (20 mg total) by mouth daily. Take 1-5 tablets as needed about 45 minutes prior to sexual activity 30 tablet 11   . vitamin B-12 (CYANOCOBALAMIN) 1000 MCG tablet Take by mouth.        No Known Allergies   Past Medical History:  Diagnosis Date  . Allergy   . Hypertension     Review of systems:  Otherwise negative.    Physical Exam  Gen: Alert, oriented. Appears stated age.  HEENT: [place]/AT. PERRLA. Lungs: CTA, no wheezes. CV: RR nl S1, S2. Abd: soft, benign, no masses. BS+ Ext: No edema. Pulses 2+    Planned procedures: Proceed with colonoscopy. The patient understands the nature of the planned procedure, indications, risks, alternatives and potential complications including but not limited to bleeding, infection, perforation,  damage to internal organs and possible oversedation/side effects from anesthesia. The patient agrees and gives consent to proceed.  Please refer to procedure notes for findings, recommendations and patient disposition/instructions.     Roshan Salamon K. Norma Fredrickson, M.D. Gastroenterology 07/04/2020  12:59 PM

## 2020-07-04 NOTE — Anesthesia Postprocedure Evaluation (Signed)
Anesthesia Post Note  Patient: Reginald Stevens  Procedure(s) Performed: COLONOSCOPY WITH PROPOFOL (N/A )  Patient location during evaluation: Endoscopy Anesthesia Type: General Level of consciousness: awake and alert and oriented Pain management: pain level controlled Vital Signs Assessment: post-procedure vital signs reviewed and stable Respiratory status: spontaneous breathing, nonlabored ventilation and respiratory function stable Cardiovascular status: blood pressure returned to baseline and stable Postop Assessment: no signs of nausea or vomiting Anesthetic complications: no   No complications documented.   Last Vitals:  Vitals:   07/04/20 1325 07/04/20 1335  BP: 103/63   Pulse: 84   Resp: 20 16  Temp: 36.8 C   SpO2: 100%     Last Pain:  Vitals:   07/04/20 1345  TempSrc:   PainSc: 0-No pain                 Niza Soderholm

## 2020-07-04 NOTE — Anesthesia Procedure Notes (Signed)
Date/Time: 07/04/2020 1:16 PM Performed by: Stormy Fabian, CRNA Pre-anesthesia Checklist: Patient identified, Emergency Drugs available, Suction available and Patient being monitored Patient Re-evaluated:Patient Re-evaluated prior to induction Oxygen Delivery Method: Supernova nasal CPAP Induction Type: IV induction Dental Injury: Teeth and Oropharynx as per pre-operative assessment  Comments: Nasal cannula with etCO2 monitoring

## 2022-09-29 ENCOUNTER — Emergency Department: Payer: BC Managed Care – PPO

## 2022-09-29 ENCOUNTER — Emergency Department
Admission: EM | Admit: 2022-09-29 | Discharge: 2022-09-29 | Disposition: A | Discharge status: Home / Self Care | Payer: BC Managed Care – PPO | Attending: Student in an Organized Health Care Education/Training Program | Admitting: Student in an Organized Health Care Education/Training Program

## 2022-09-29 ENCOUNTER — Other Ambulatory Visit: Payer: Self-pay

## 2022-09-29 DIAGNOSIS — I10 Essential (primary) hypertension: Secondary | ICD-10-CM | POA: Diagnosis not present

## 2022-09-29 DIAGNOSIS — L03116 Cellulitis of left lower limb: Secondary | ICD-10-CM | POA: Diagnosis not present

## 2022-09-29 DIAGNOSIS — M79605 Pain in left leg: Secondary | ICD-10-CM | POA: Diagnosis present

## 2022-09-29 LAB — COMPREHENSIVE METABOLIC PANEL
ALT: 13 U/L (ref 0–44)
AST: 18 U/L (ref 15–41)
Albumin: 3.4 g/dL — ABNORMAL LOW (ref 3.5–5.0)
Alkaline Phosphatase: 45 U/L (ref 38–126)
Anion gap: 5 (ref 5–15)
BUN: 18 mg/dL (ref 8–23)
CO2: 25 mmol/L (ref 22–32)
Calcium: 8.9 mg/dL (ref 8.9–10.3)
Chloride: 111 mmol/L (ref 98–111)
Creatinine, Ser: 1.54 mg/dL — ABNORMAL HIGH (ref 0.61–1.24)
GFR, Estimated: 49 mL/min — ABNORMAL LOW (ref >60)
Glucose, Bld: 114 mg/dL — ABNORMAL HIGH (ref 70–99)
Potassium: 3.9 mmol/L (ref 3.5–5.1)
Sodium: 141 mmol/L (ref 135–145)
Total Bilirubin: 0.6 mg/dL (ref 0.3–1.2)
Total Protein: 7.1 g/dL (ref 6.5–8.1)

## 2022-09-29 LAB — CBC WITH DIFFERENTIAL/PLATELET
Abs Immature Granulocytes: 0.02 10*3/uL (ref 0.00–0.07)
Basophils Absolute: 0 10*3/uL (ref 0.0–0.1)
Basophils Relative: 0 %
Eosinophils Absolute: 0.2 10*3/uL (ref 0.0–0.5)
Eosinophils Relative: 3 %
HCT: 44.9 % (ref 39.0–52.0)
Hemoglobin: 14.6 g/dL (ref 13.0–17.0)
Immature Granulocytes: 0 %
Lymphocytes Relative: 35 %
Lymphs Abs: 2.1 10*3/uL (ref 0.7–4.0)
MCH: 31.5 pg (ref 26.0–34.0)
MCHC: 32.5 g/dL (ref 30.0–36.0)
MCV: 97 fL (ref 80.0–100.0)
Monocytes Absolute: 0.4 10*3/uL (ref 0.1–1.0)
Monocytes Relative: 7 %
Neutro Abs: 3.4 10*3/uL (ref 1.7–7.7)
Neutrophils Relative %: 55 %
Platelets: 202 10*3/uL (ref 150–400)
RBC: 4.63 MIL/uL (ref 4.22–5.81)
RDW: 12.6 % (ref 11.5–15.5)
WBC: 6.1 10*3/uL (ref 4.0–10.5)
nRBC: 0 % (ref 0.0–0.2)

## 2022-09-29 LAB — LIPASE, BLOOD: Lipase: 37 U/L (ref 11–51)

## 2022-09-29 MED ORDER — CEFTRIAXONE SODIUM 1 G IJ SOLR: 1.0000 g | Freq: Once | INTRAMUSCULAR | Status: DC

## 2022-09-29 MED ORDER — DOXYCYCLINE MONOHYDRATE 100 MG PO TABS: 100.0000 mg | ORAL_TABLET | Freq: Two times a day (BID) | ORAL | 0 refills | Status: AC

## 2022-09-29 MED ORDER — SODIUM CHLORIDE 0.9 % IV SOLN
1.0000 g | Freq: Once | INTRAVENOUS | Status: AC
  Administered 2022-09-29: 1 g via INTRAVENOUS
  Filled 2022-09-29: qty 10

## 2022-09-29 MED ORDER — CEPHALEXIN 500 MG PO CAPS: 500.0000 mg | ORAL_CAPSULE | Freq: Four times a day (QID) | ORAL | 0 refills | Status: AC

## 2022-09-29 NOTE — Discharge Instructions (Signed)
Take Keflex 4 times daily for the next 7 days. Take doxycycline twice daily for the next 7 days.

## 2022-09-29 NOTE — ED Notes (Signed)
Pt called x 3, assumed LWBS

## 2022-09-29 NOTE — ED Triage Notes (Signed)
Pt leg is not hot or red at this time.

## 2022-09-29 NOTE — ED Triage Notes (Signed)
Pt to Ed from home for leg pain x 3 days. Pt ambulatory in triage. Pt has previous HX of clot but this feels different than last time and he was unsure whether to come in or not to have an [place] to see if he had another clot in his leg. Pt is CAOx4 and in no acute distress at this time.

## 2022-09-29 NOTE — ED Notes (Signed)
[place] tech called, and pt was in their suite getting his ultrasound. Awaiting room from lobby

## 2022-09-29 NOTE — ED Notes (Signed)
Redness noted to left shin and warm to touch

## 2022-09-29 NOTE — ED Provider Notes (Signed)
[facility] Provider Note  Patient Contact: 10:26 PM (approximate)   History   Leg Pain (LEFT x 3 days)   HPI  Reginald Stevens is a 67 y.o. male with a history of hypertension, presents to the emergency department with erythema and swelling of the left leg.  Patient endorses a history of vascular insufficiency but states that he does not normally have overlying erythema or pain.  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 Patient has had chills at home.  Patient denies a history of cellulitis in the past.  No chest pain, chest tightness or abdominal pain.      Physical Exam   Triage Vital Signs: ED Triage Vitals  Enc Vitals Group     BP 09/29/22 1933 (!) 141/79     Pulse Rate 09/29/22 1933 (!) 106     Resp 09/29/22 1933 20     Temp 09/29/22 1933 98.4 F (36.9 C)     Temp Source 09/29/22 1933 Oral     SpO2 09/29/22 1933 100 %     Weight 09/29/22 1933 (!) 340 lb (154.2 kg)     Height 09/29/22 1933 5\' 11"  (1.803 m)     Head Circumference --      Peak Flow --      Pain Score 09/29/22 1937 2     Pain Loc --      Pain Edu? --      Excl. in GC? --     Most recent vital signs: Vitals:   09/29/22 2200 09/29/22 2230  BP: 116/66 135/69  Pulse: 83 78  Resp:  17  Temp:    SpO2:  100%     General: Alert and in no acute distress. Eyes:  PERRL. EOMI. Head: No acute traumatic findings ENT:      Nose: No congestion/rhinnorhea.      Mouth/Throat: Mucous membranes are moist. Neck: No stridor. No cervical spine tenderness to palpation. Cardiovascular:  Good peripheral perfusion Respiratory: Normal respiratory effort without tachypnea or retractions. Lungs CTAB. Good air entry to the bases with no decreased or absent breath sounds. Gastrointestinal: Bowel sounds 4 quadrants. Soft and nontender to palpation. No guarding or rigidity. No palpable masses. No distention. No  CVA tenderness. Musculoskeletal: Full range of motion to all extremities.  Neurologic:  No gross focal neurologic deficits are appreciated.  Skin: Patient has erythema and 3+ pitting edema of the left lower extremity.    ED Results / Procedures / Treatments   Labs (all labs ordered are listed, but only abnormal results are displayed) Labs Reviewed  COMPREHENSIVE METABOLIC PANEL - Abnormal; Notable for the following components:      Result Value   Glucose, Bld 114 (*)    Creatinine, Ser 1.54 (*)    Albumin 3.4 (*)    GFR, Estimated 49 (*)    All other components within normal limits  CBC WITH DIFFERENTIAL/PLATELET  LIPASE, BLOOD        RADIOLOGY  I personally viewed and evaluated these images as part of my medical decision making, as well as reviewing the written report by the radiologist.  ED Provider Interpretation: No evidence of DVT within the left lower extremity.   PROCEDURES:  Critical Care performed: No  Procedures   MEDICATIONS ORDERED IN ED: Medications  cefTRIAXone (ROCEPHIN) 1 g in sodium chloride 0.9 % 100 mL IVPB (has no administration in time range)  IMPRESSION / MDM / ASSESSMENT AND PLAN / ED COURSE  I reviewed the triage vital signs and the nursing notes.                              Assessment and plan:   Cellulitis 67 year old male presents to the emergency department with left lower extremity swelling and erythema.  Vital signs are reassuring.  On exam, patient was alert and nontoxic-appearing.  Patient had erythema overlying the left lower leg with 3+ pitting edema.  CBC reassuring with normal white blood cell count.  CMP largely consistent with patient's baseline.  Lipase within reference range.  Venous ultrasound showed no signs of DVT.  I did consider admitting patient for left lower extremity cellulitis.  I reviewed the pros and cons of immediate admission versus a trial of outpatient antibiotics and patient stated that he would  like to try outpatient management initially.  I cautioned patient that if left lower extremity erythema or swelling were to worsen at home, he should return to the emergency department.  He was given injection of Rocephin in the emergency department prior to discharge and was discharged with doxycycline and Keflex.   FINAL CLINICAL IMPRESSION(S) / ED DIAGNOSES   Final diagnoses:  Cellulitis of left lower extremity     Rx / DC Orders   ED Discharge Orders          Ordered    doxycycline (ADOXA) 100 MG tablet  2 times daily        09/29/22 2333    cephALEXin (KEFLEX) 500 MG capsule  4 times daily        09/29/22 2333             Note:  This document was prepared using Dragon voice recognition software and may include unintentional dictation errors.   Pia Mau King, Cordelia Poche 09/29/22 2339    Willy Eddy, MD 09/30/22 [phone]

## 2023-07-10 ENCOUNTER — Other Ambulatory Visit: Payer: Self-pay | Admitting: Internal Medicine

## 2023-07-10 DIAGNOSIS — J42 Unspecified chronic bronchitis: Secondary | ICD-10-CM

## 2023-07-10 DIAGNOSIS — R053 Chronic cough: Secondary | ICD-10-CM
